# Patient Record
Sex: Male | Born: 1955 | Race: White | Hispanic: No | Smoking: Current every day smoker
Health system: Southern US, Community
[De-identification: ages and names within clinical notes are randomized; demographics above are authoritative.]

## PROBLEM LIST (undated history)

## (undated) DIAGNOSIS — I10 Essential (primary) hypertension: Secondary | ICD-10-CM

## (undated) DIAGNOSIS — J449 Chronic obstructive pulmonary disease, unspecified: Secondary | ICD-10-CM

## (undated) DIAGNOSIS — J939 Pneumothorax, unspecified: Secondary | ICD-10-CM

## (undated) HISTORY — PX: CHEST TUBE INSERTION: SHX231

---

## 2006-08-08 ENCOUNTER — Ambulatory Visit: Payer: Self-pay | Admitting: Gastroenterology

## 2007-05-17 ENCOUNTER — Ambulatory Visit (HOSPITAL_COMMUNITY): Admission: RE | Admit: 2007-05-17 | Discharge: 2007-05-17 | Payer: Self-pay | Admitting: Internal Medicine

## 2010-03-28 ENCOUNTER — Encounter: Payer: Self-pay | Admitting: Internal Medicine

## 2010-03-28 ENCOUNTER — Encounter: Payer: Self-pay | Admitting: Gastroenterology

## 2010-07-23 NOTE — H&P (Signed)
NAME:  JENNIE, BOLAR NO.:  [id number]   MEDICAL RECORD NO.:  [id number]          PATIENT TYPE:  AMB   LOCATION:                                FACILITY:  [facility]   PHYSICIAN:  Dalia Heading, M.D.  DATE OF BIRTH:  March 03, 1956   DATE OF ADMISSION:  DATE OF DISCHARGE:  LH                                HISTORY & PHYSICAL   CHIEF COMPLAINT:  Need for screening colonoscopy.   HISTORY OF PRESENT ILLNESS:  The patient is a 55 year old white male who is  referred for endoscopic evaluation. Needs a colonoscopy for screening  purposes. No abdominal pain, weight loss, nausea, vomiting, diarrhea,  constipation, melena, or hematochezia has been noted. He has never had a  colonoscopy. There is no family history of colon carcinoma.   PAST MEDICAL HISTORY:  1.  Nonspecific chronic pain.  2.  Hypertension.   PAST SURGICAL HISTORY:  1.  Vasectomy.  2.  Wrist surgery.  3.  Nasal surgery.   CURRENT MEDICATIONS:  Xanax, hydrochlorothiazide, Prilosec, Lortab.   ALLERGIES:  CODEINE and STADOL   REVIEW OF SYSTEMS:  The patient smokes a pack of cigarettes every other day.  He drinks alcohol socially.   PHYSICAL EXAMINATION:  GENERAL:  The patient is a well-developed, well-  nourished, white male in no acute distress.  LUNGS:  Clear to auscultation with equal breath sounds bilaterally.  HEART:  Reveals regular rate and rhythm without S3, S4, or murmurs.  ABDOMEN:  Soft, nontender, nondistended. No hepatosplenomegaly or masses are  noted.  RECTAL:  Deferred to the procedure.   IMPRESSION:  Need for screening colonoscopy.   PLAN:  The patient is scheduled for colonoscopy on May 03, 2005. The  risks and benefits of the procedure including bleeding and perforation were  fully explained to the patient who gave informed consent.      Dalia Heading, M.D.  Electronically Signed     MAJ/MEDQ  D:  04/12/2005  T:  04/12/2005  Job:  [id number]   cc:   [facility]  Fax: [phone]   Madelin Rear. Sherwood Gambler, MD  Fax: [phone]

## 2012-10-26 ENCOUNTER — Encounter (HOSPITAL_COMMUNITY): Payer: Self-pay | Admitting: *Deleted

## 2012-10-26 ENCOUNTER — Emergency Department (HOSPITAL_COMMUNITY)
Admission: EM | Admit: 2012-10-26 | Discharge: 2012-10-26 | Disposition: A | Discharge status: Home / Self Care | Payer: Self-pay | Attending: Emergency Medicine | Admitting: Emergency Medicine

## 2012-10-26 ENCOUNTER — Emergency Department (HOSPITAL_COMMUNITY): Payer: Self-pay

## 2012-10-26 DIAGNOSIS — Z8709 Personal history of other diseases of the respiratory system: Secondary | ICD-10-CM | POA: Insufficient documentation

## 2012-10-26 DIAGNOSIS — Z792 Long term (current) use of antibiotics: Secondary | ICD-10-CM | POA: Insufficient documentation

## 2012-10-26 DIAGNOSIS — Z79899 Other long term (current) drug therapy: Secondary | ICD-10-CM | POA: Insufficient documentation

## 2012-10-26 DIAGNOSIS — I10 Essential (primary) hypertension: Secondary | ICD-10-CM | POA: Insufficient documentation

## 2012-10-26 DIAGNOSIS — F17200 Nicotine dependence, unspecified, uncomplicated: Secondary | ICD-10-CM | POA: Insufficient documentation

## 2012-10-26 DIAGNOSIS — J441 Chronic obstructive pulmonary disease with (acute) exacerbation: Secondary | ICD-10-CM

## 2012-10-26 HISTORY — DX: Pneumothorax, unspecified: J93.9

## 2012-10-26 HISTORY — DX: Chronic obstructive pulmonary disease, unspecified: J44.9

## 2012-10-26 HISTORY — DX: Essential (primary) hypertension: I10

## 2012-10-26 LAB — BASIC METABOLIC PANEL
Chloride: 85 mEq/L — ABNORMAL LOW (ref 96–112)
GFR calc Af Amer: >90 mL/min (ref >90)
GFR calc non Af Amer: >90 mL/min (ref >90)
Glucose, Bld: 114 mg/dL — ABNORMAL HIGH (ref 70–99)
Potassium: 2.9 mEq/L — ABNORMAL LOW (ref 3.5–5.1)
Sodium: 130 mEq/L — ABNORMAL LOW (ref 135–145)

## 2012-10-26 LAB — CBC WITH DIFFERENTIAL/PLATELET
Eosinophils Absolute: 0.1 10*3/uL (ref 0.0–0.7)
Hemoglobin: 16.1 g/dL (ref 13.0–17.0)
Lymphs Abs: 1.2 10*3/uL (ref 0.7–4.0)
MCH: 33.5 pg (ref 26.0–34.0)
Monocytes Relative: 11 % (ref 3–12)
Neutro Abs: 6.8 10*3/uL (ref 1.7–7.7)
Neutrophils Relative %: 74 % (ref 43–77)
Platelets: 265 10*3/uL (ref 150–400)
RBC: 4.81 MIL/uL (ref 4.22–5.81)
WBC: 9.2 10*3/uL (ref 4.0–10.5)

## 2012-10-26 MED ORDER — POTASSIUM CHLORIDE CRYS ER 20 MEQ PO TBCR
40.0000 meq | EXTENDED_RELEASE_TABLET | Freq: Once | ORAL | Status: AC
  Administered 2012-10-26: 40 meq via ORAL
  Filled 2012-10-26: qty 2

## 2012-10-26 MED ORDER — ALBUTEROL SULFATE (5 MG/ML) 0.5% IN NEBU
5.0000 mg | INHALATION_SOLUTION | Freq: Once | RESPIRATORY_TRACT | Status: AC
  Administered 2012-10-26: 5 mg via RESPIRATORY_TRACT
  Filled 2012-10-26: qty 1

## 2012-10-26 MED ORDER — AMOXICILLIN 500 MG PO CAPS: 500.0000 mg | ORAL_CAPSULE | Freq: Three times a day (TID) | ORAL | Status: AC

## 2012-10-26 MED ORDER — IPRATROPIUM BROMIDE HFA 17 MCG/ACT IN AERS: 2.0000 | INHALATION_SPRAY | Freq: Four times a day (QID) | RESPIRATORY_TRACT | Status: AC

## 2012-10-26 MED ORDER — IPRATROPIUM BROMIDE 0.02 % IN SOLN
0.5000 mg | Freq: Once | RESPIRATORY_TRACT | Status: AC
Start: 2012-10-26 — End: 2012-10-26
  Administered 2012-10-26: 0.5 mg via RESPIRATORY_TRACT
  Filled 2012-10-26: qty 2.5

## 2012-10-26 NOTE — ED Notes (Signed)
Worsening sob x 2 wks.  Speaking clear full sentences in triage.  Unlabored breathing in triage.

## 2012-10-26 NOTE — ED Provider Notes (Signed)
CSN: [id number]     Arrival date & time 10/26/12  1740 History     First MD Initiated Contact with Patient 10/26/12 1820     Chief Complaint  Patient presents with  . Shortness of Breath   (Consider location/radiation/quality/duration/timing/severity/associated sxs/prior Treatment) Patient is a 57 y.o. male presenting with shortness of breath. The history is provided by the patient (the pt complains of sob).  Shortness of Breath Severity:  Moderate Onset quality:  Gradual Timing:  Constant Progression:  Worsening Chronicity:  Recurrent Context: activity   Associated symptoms: no abdominal pain, no chest pain, no cough, no headaches and no rash     Past Medical History  Diagnosis Date  . COPD (chronic obstructive pulmonary disease)   . Pneumothorax   . Hypertension    Past Surgical History  Procedure Laterality Date  . Chest tube insertion     No family history on file. History  Substance Use Topics  . Smoking status: Current Every Day Smoker    Types: Cigarettes  . Smokeless tobacco: Not on file  . Alcohol Use: No    Review of Systems  Constitutional: Negative for appetite change and fatigue.  HENT: Negative for congestion, sinus pressure and ear discharge.   Eyes: Negative for discharge.  Respiratory: Positive for shortness of breath. Negative for cough.   Cardiovascular: Negative for chest pain.  Gastrointestinal: Negative for abdominal pain and diarrhea.  Genitourinary: Negative for frequency and hematuria.  Musculoskeletal: Negative for back pain.  Skin: Negative for rash.  Neurological: Negative for seizures and headaches.  Psychiatric/Behavioral: Negative for hallucinations.    Allergies  Codeine and Stadol  Home Medications   Current Outpatient Rx  Name  Route  Sig  Dispense  Refill  . albuterol (PROVENTIL HFA;VENTOLIN HFA) 108 (90 BASE) MCG/ACT inhaler   Inhalation   Inhale 2 puffs into the lungs every 6 (six) hours as needed for wheezing.         . clonazePAM (KLONOPIN) 0.5 MG tablet   Oral   Take 0.5 mg by mouth 3 (three) times daily as needed for anxiety.         . diazepam (VALIUM) 10 MG tablet   Oral   Take 10 mg by mouth every 6 (six) hours as needed for anxiety.         Marland Kitchen FLUoxetine (PROZAC) 20 MG capsule   Oral   Take 20 mg by mouth daily.         . hydrochlorothiazide (HYDRODIURIL) 25 MG tablet   Oral   Take 25 mg by mouth daily.         . magnesium oxide (MAG-OX) 400 MG tablet   Oral   Take 400 mg by mouth daily.         . Multiple Vitamin (MULTIVITAMIN WITH MINERALS) TABS tablet   Oral   Take 1 tablet by mouth daily.         Marland Kitchen oxyCODONE (ROXICODONE) 15 MG immediate release tablet   Oral   Take 15 mg by mouth every 4 (four) hours as needed for pain.         Marland Kitchen oxycodone (ROXICODONE) 30 MG immediate release tablet   Oral   Take 30 mg by mouth every 4 (four) hours as needed for pain.         . potassium chloride SA (K-DUR,KLOR-CON) 20 MEQ tablet   Oral   Take 20 mEq by mouth daily.         Marland Kitchen  amoxicillin (AMOXIL) 500 MG capsule   Oral   Take 1 capsule (500 mg total) by mouth 3 (three) times daily.   21 capsule   0   . ipratropium (ATROVENT HFA) 17 MCG/ACT inhaler   Inhalation   Inhale 2 puffs into the lungs every 6 (six) hours.   1 Inhaler   12    BP 109/82  Pulse 86  Temp(Src) 98.1 F (36.7 C) (Oral)  Resp 18  Ht 6\' 1"  (1.854 m)  Wt 190 lb (86.183 kg)  BMI 25.07 kg/m2  SpO2 82% Physical Exam  Constitutional: He is oriented to person, place, and time. He appears well-developed.  HENT:  Head: Normocephalic.  Eyes: Conjunctivae and EOM are normal. No scleral icterus.  Neck: Neck supple. No thyromegaly present.  Cardiovascular: Normal rate and regular rhythm.  Exam reveals no gallop and no friction rub.   No murmur heard. Pulmonary/Chest: No stridor. He has wheezes. He has no rales. He exhibits no tenderness.  Abdominal: He exhibits no distension. There is no  tenderness. There is no rebound.  Musculoskeletal: Normal range of motion. He exhibits no edema.  Lymphadenopathy:    He has no cervical adenopathy.  Neurological: He is oriented to person, place, and time. Coordination normal.  Skin: No rash noted. No erythema.  Psychiatric: He has a normal mood and affect. His behavior is normal.    ED Course   Procedures (including critical care time)  Labs Reviewed  BASIC METABOLIC PANEL - Abnormal; Notable for the following:    Sodium 130 (*)    Potassium 2.9 (*)    Chloride 85 (*)    CO2 35 (*)    Glucose, Bld 114 (*)    All other components within normal limits  CBC WITH DIFFERENTIAL  TROPONIN I   Dg Chest 2 View  10/26/2012   *RADIOLOGY REPORT*  Clinical Data: Shortness of breath and cough.  CHEST - 2 VIEW  Comparison: 05/17/2007  Findings: Lungs are adequately inflated without consolidation or effusion.  The cardiomediastinal silhouette is within normal. There is spondylosis of the spine.  IMPRESSION: No acute cardiopulmonary disease.   Original Report Authenticated By: Elberta Fortis, M.D.   1. COPD exacerbation     MDM    Benny Lennert, MD 10/26/12 [phone]

## 2014-11-14 ENCOUNTER — Encounter (HOSPITAL_COMMUNITY): Payer: Self-pay | Admitting: Emergency Medicine

## 2014-11-14 ENCOUNTER — Emergency Department (HOSPITAL_COMMUNITY): Payer: Medicare Other

## 2014-11-14 ENCOUNTER — Emergency Department (HOSPITAL_COMMUNITY)
Admission: EM | Admit: 2014-11-14 | Discharge: 2014-11-15 | Disposition: A | Discharge status: Home / Self Care | Payer: Medicare Other | Attending: Emergency Medicine | Admitting: Emergency Medicine

## 2014-11-14 DIAGNOSIS — R531 Weakness: Secondary | ICD-10-CM | POA: Insufficient documentation

## 2014-11-14 DIAGNOSIS — R079 Chest pain, unspecified: Secondary | ICD-10-CM | POA: Insufficient documentation

## 2014-11-14 DIAGNOSIS — R5383 Other fatigue: Secondary | ICD-10-CM | POA: Diagnosis not present

## 2014-11-14 DIAGNOSIS — Z720 Tobacco use: Secondary | ICD-10-CM | POA: Insufficient documentation

## 2014-11-14 DIAGNOSIS — I10 Essential (primary) hypertension: Secondary | ICD-10-CM | POA: Insufficient documentation

## 2014-11-14 DIAGNOSIS — Z79899 Other long term (current) drug therapy: Secondary | ICD-10-CM | POA: Diagnosis not present

## 2014-11-14 DIAGNOSIS — Z7982 Long term (current) use of aspirin: Secondary | ICD-10-CM | POA: Insufficient documentation

## 2014-11-14 DIAGNOSIS — Z87828 Personal history of other (healed) physical injury and trauma: Secondary | ICD-10-CM | POA: Diagnosis not present

## 2014-11-14 DIAGNOSIS — J449 Chronic obstructive pulmonary disease, unspecified: Secondary | ICD-10-CM | POA: Diagnosis not present

## 2014-11-14 DIAGNOSIS — R2242 Localized swelling, mass and lump, left lower limb: Secondary | ICD-10-CM | POA: Insufficient documentation

## 2014-11-14 DIAGNOSIS — Z89222 Acquired absence of left upper limb above elbow: Secondary | ICD-10-CM | POA: Diagnosis not present

## 2014-11-14 DIAGNOSIS — R4182 Altered mental status, unspecified: Secondary | ICD-10-CM | POA: Diagnosis not present

## 2014-11-14 DIAGNOSIS — R300 Dysuria: Secondary | ICD-10-CM | POA: Diagnosis present

## 2014-11-14 LAB — CBC WITH DIFFERENTIAL/PLATELET
BASOS PCT: 1 % (ref 0–1)
Basophils Absolute: 0 10*3/uL (ref 0.0–0.1)
EOS ABS: 0.2 10*3/uL (ref 0.0–0.7)
Eosinophils Relative: 6 % — ABNORMAL HIGH (ref 0–5)
HCT: 30.4 % — ABNORMAL LOW (ref 39.0–52.0)
HEMOGLOBIN: 10 g/dL — AB (ref 13.0–17.0)
Lymphocytes Relative: 36 % (ref 12–46)
Lymphs Abs: 1 10*3/uL (ref 0.7–4.0)
MCH: 33.6 pg (ref 26.0–34.0)
MCHC: 32.9 g/dL (ref 30.0–36.0)
MCV: 102 fL — ABNORMAL HIGH (ref 78.0–100.0)
MONO ABS: 0.4 10*3/uL (ref 0.1–1.0)
MONOS PCT: 16 % — AB (ref 3–12)
NEUTROS PCT: 40 % — AB (ref 43–77)
Neutro Abs: 1.1 10*3/uL — ABNORMAL LOW (ref 1.7–7.7)
Platelets: 114 10*3/uL — ABNORMAL LOW (ref 150–400)
RBC: 2.98 MIL/uL — ABNORMAL LOW (ref 4.22–5.81)
RDW: 15.1 % (ref 11.5–15.5)
WBC: 2.7 10*3/uL — ABNORMAL LOW (ref 4.0–10.5)

## 2014-11-14 LAB — COMPREHENSIVE METABOLIC PANEL
ALBUMIN: 2 g/dL — AB (ref 3.5–5.0)
ALT: 27 U/L (ref 17–63)
ANION GAP: 5 (ref 5–15)
AST: 58 U/L — ABNORMAL HIGH (ref 15–41)
Alkaline Phosphatase: 117 U/L (ref 38–126)
BUN: 6 mg/dL (ref 6–20)
CHLORIDE: 97 mmol/L — AB (ref 101–111)
CO2: 33 mmol/L — AB (ref 22–32)
Calcium: 7.6 mg/dL — ABNORMAL LOW (ref 8.9–10.3)
Creatinine, Ser: 0.77 mg/dL (ref 0.61–1.24)
GFR calc Af Amer: >60 mL/min (ref >60)
GFR calc non Af Amer: >60 mL/min (ref >60)
GLUCOSE: 110 mg/dL — AB (ref 65–99)
POTASSIUM: 3 mmol/L — AB (ref 3.5–5.1)
SODIUM: 135 mmol/L (ref 135–145)
Total Bilirubin: 0.9 mg/dL (ref 0.3–1.2)
Total Protein: 5.7 g/dL — ABNORMAL LOW (ref 6.5–8.1)

## 2014-11-14 LAB — URINALYSIS, ROUTINE W REFLEX MICROSCOPIC
BILIRUBIN URINE: NEGATIVE
Glucose, UA: NEGATIVE mg/dL
Hgb urine dipstick: NEGATIVE
KETONES UR: NEGATIVE mg/dL
Leukocytes, UA: NEGATIVE
NITRITE: NEGATIVE
PH: 6.5 (ref 5.0–8.0)
Protein, ur: NEGATIVE mg/dL
Specific Gravity, Urine: 1.01 (ref 1.005–1.030)
Urobilinogen, UA: 2 mg/dL — ABNORMAL HIGH (ref 0.0–1.0)

## 2014-11-14 MED ORDER — NALOXONE HCL 1 MG/ML IJ SOLN
2.0000 mg | Freq: Once | INTRAMUSCULAR | Status: AC
  Administered 2014-11-14: 2 mg via INTRAVENOUS
  Filled 2014-11-14: qty 2

## 2014-11-14 MED ORDER — SODIUM CHLORIDE 0.9 % IV BOLUS (SEPSIS)
500.0000 mL | Freq: Once | INTRAVENOUS | Status: AC
  Administered 2014-11-14: 500 mL via INTRAVENOUS

## 2014-11-14 MED ORDER — NALOXONE HCL 0.4 MG/ML IJ SOLN
0.4000 mg | Freq: Once | INTRAMUSCULAR | Status: AC
  Administered 2014-11-14: 0.4 mg via INTRAVENOUS
  Filled 2014-11-14: qty 1

## 2014-11-14 MED ORDER — SODIUM CHLORIDE 0.9 % IV SOLN
INTRAVENOUS | Status: DC
  Administered 2014-11-14: 21:00:00 via INTRAVENOUS

## 2014-11-14 NOTE — ED Notes (Signed)
Wife back in room with pt at bedside while Narcan administered . No change in pt's condition or behavior at this time.

## 2014-11-14 NOTE — Discharge Instructions (Signed)
Extensive workup here without any significant findings. Would recommend continued follow-up with the services at [facility]. Would recommend on the work through the home health nurse for possible rehabilitation center placement. Return for any new or worse symptoms.

## 2014-11-14 NOTE — ED Notes (Signed)
o2 was reduced to 1l by Dr Elbert Ewings when he was in room with pt . O2 sat 98-99 %

## 2014-11-14 NOTE — ED Notes (Signed)
Pt resting quietly prior to admin of narcan , during administration pt's breathing noted to incresae in rate , started moving around in bed, yawning . O2 sat noted to be up to 98%
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
## 2014-11-14 NOTE — ED Notes (Addendum)
Pt yelling and moaning - not opening his eyes, not talking to staff or his wife. Dr at bedside.  Oxygen stopped at this time by MD

## 2014-11-14 NOTE — ED Notes (Signed)
Sine o2 turned off by MD O2 sats noted in low 90's on RA

## 2014-11-14 NOTE — ED Notes (Signed)
Pt returned from having CT scan done.

## 2014-11-15 NOTE — ED Notes (Signed)
Patient transported back to home per [facility] all vital signs stable, no distress noted.

## 2016-12-13 IMAGING — CT CT HEAD W/O CM
1 series · 16 of 30 positions shown, 20 images · non-contrast
Comparison: None.

CLINICAL DATA: 59-year-old male with trauma.

EXAM:
CT HEAD WITHOUT CONTRAST
TECHNIQUE: Contiguous axial images were obtained from the base of the skull
through the vertex without intravenous contrast.

[Series 2: headtrauma 4.8 h37s · axial · 0.44mm/px · z∈[+118,+273]mm · 16 of 36 slices shown, 20 images]
[im 2/36  brain]
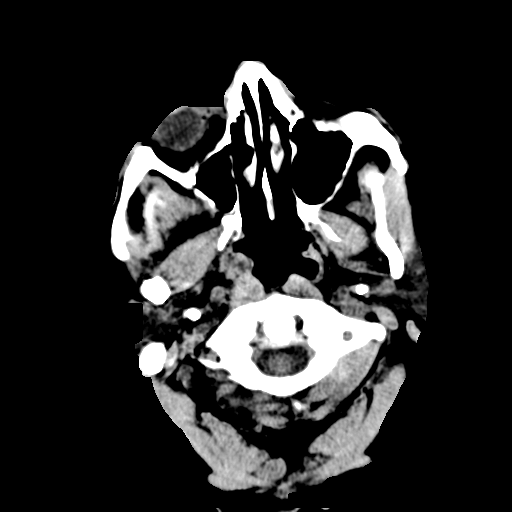
[im 2/36  bone]
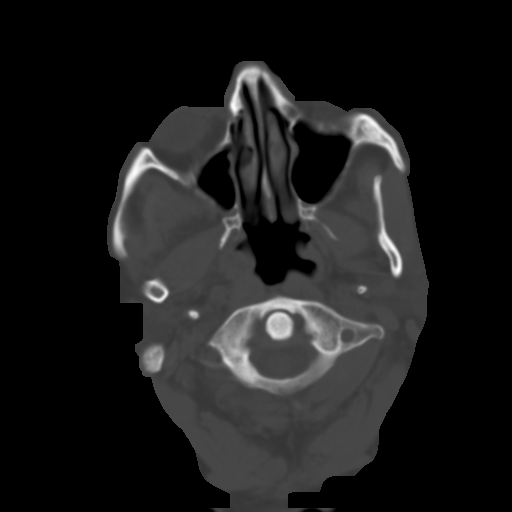
[im 4/36  brain]
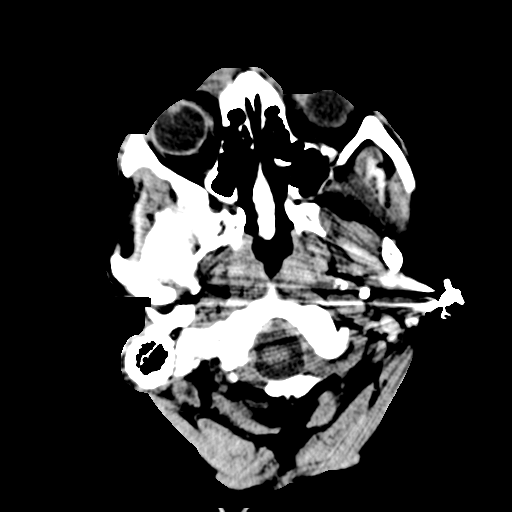
[im 7/36  brain]
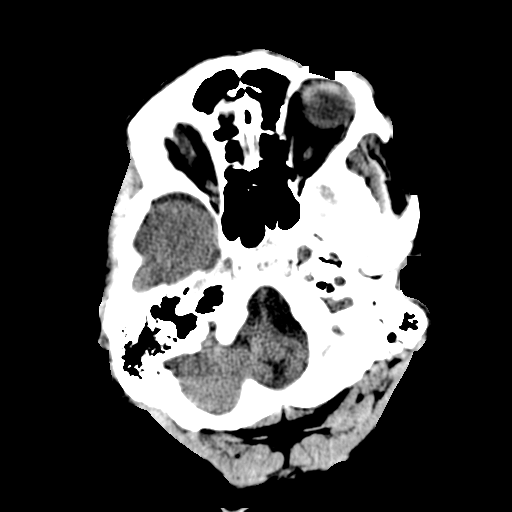
[im 9/36  brain]
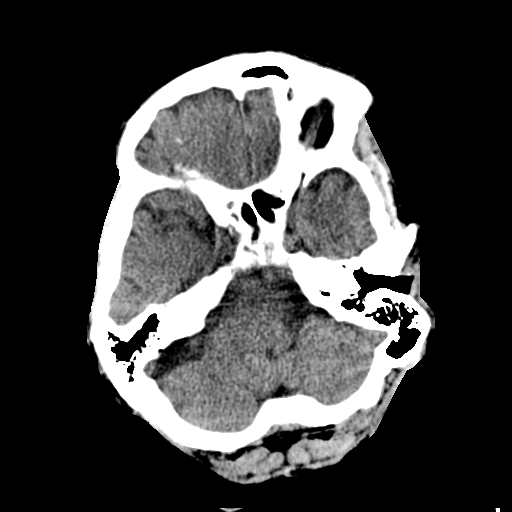
[im 10/36  brain]
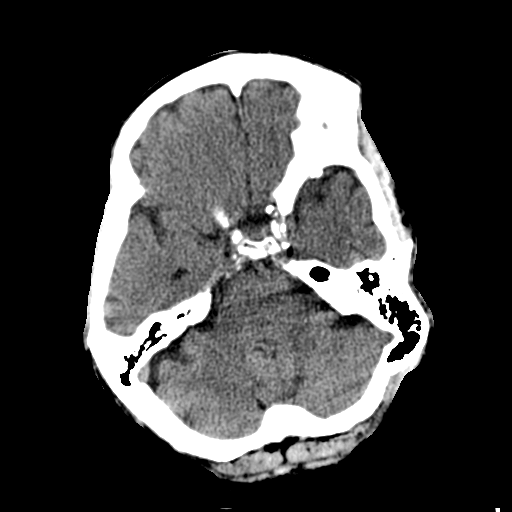
[im 10/36  bone]
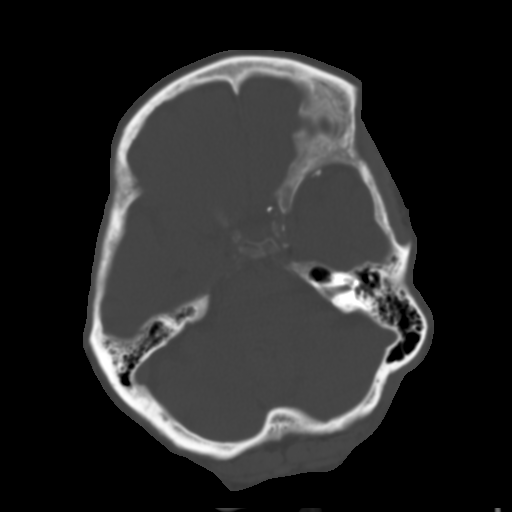
[im 13/36  brain]
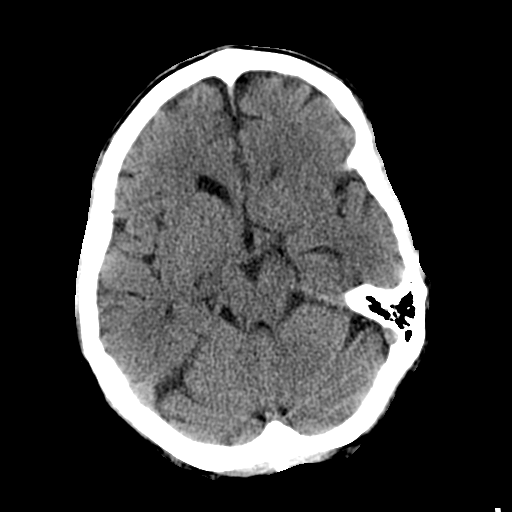
[im 15/36  brain]
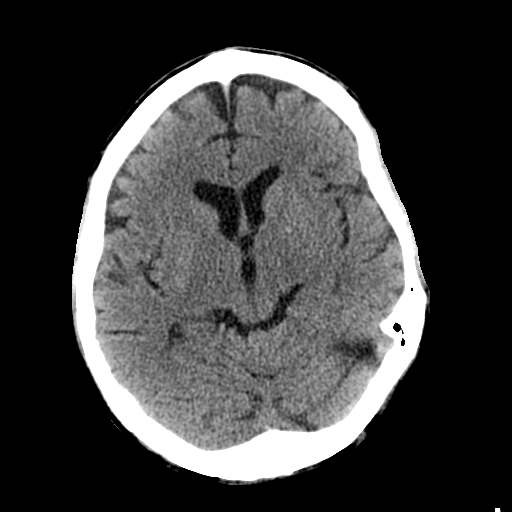
[im 17/36  brain]
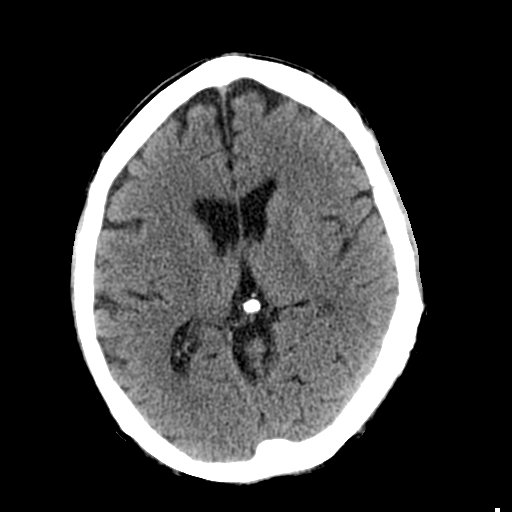
[im 19/36  brain]
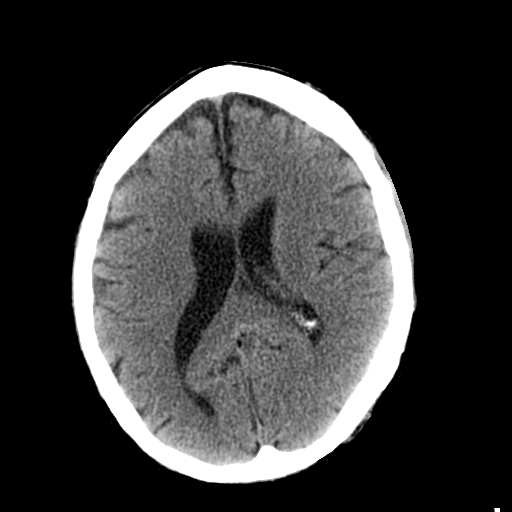
[im 19/36  bone]
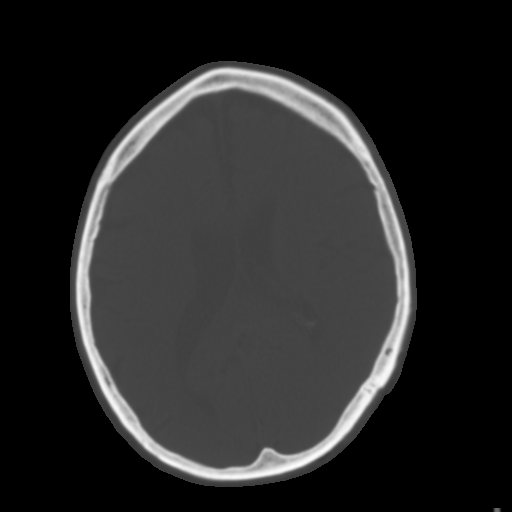
[im 21/36  brain]
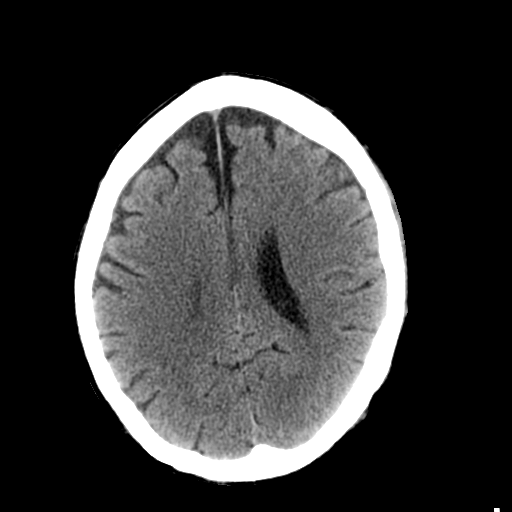
[im 23/36  brain]
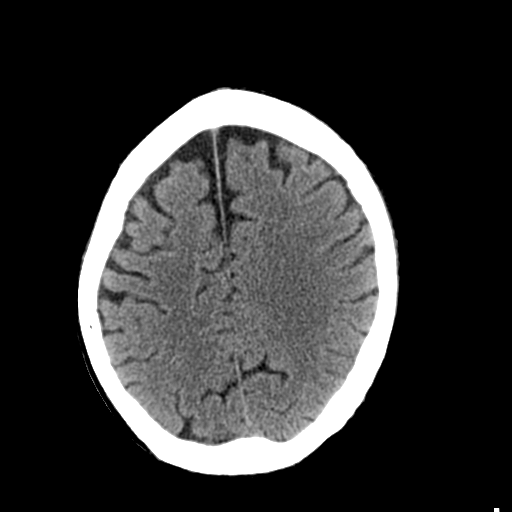
[im 26/36  brain]
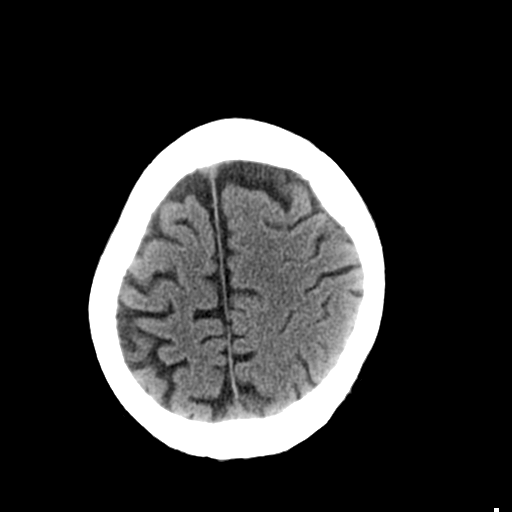
[im 27/36  brain]
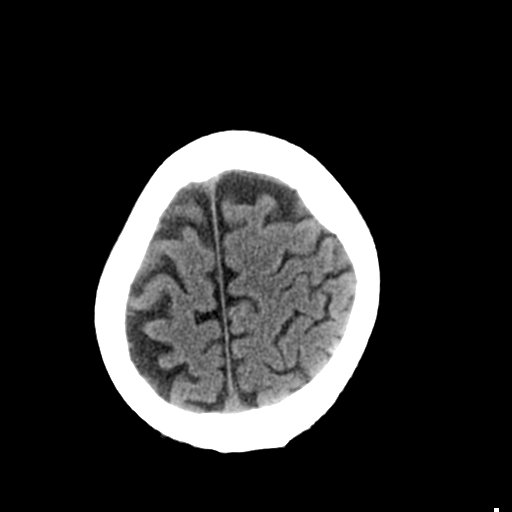
[im 27/36  bone]
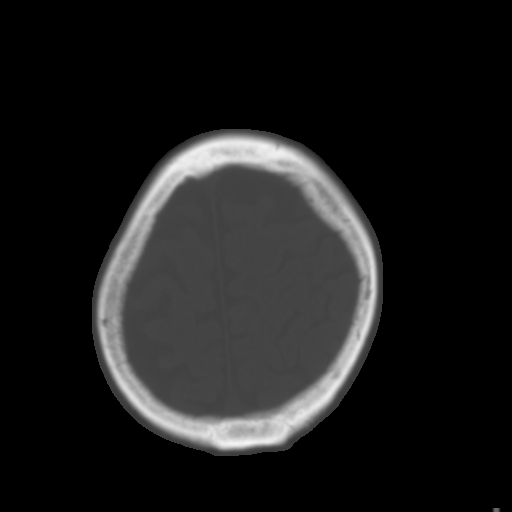
[im 29/36  brain]
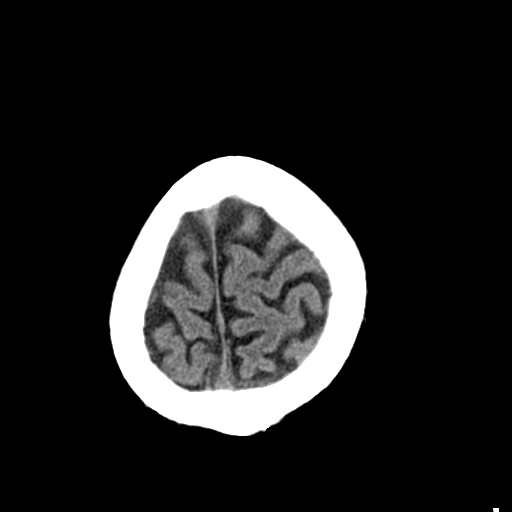
[im 32/36  brain]
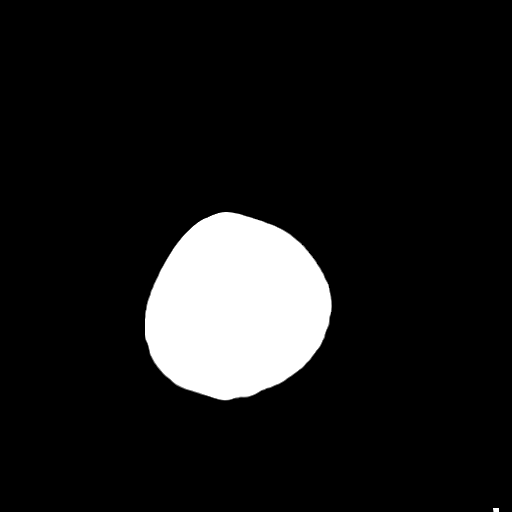
[im 34/36  brain]
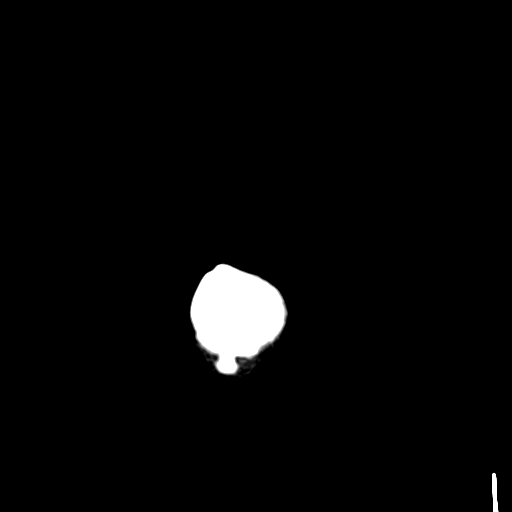

[16 of 30 positions shown; findings below may reference images not displayed]

FINDINGS: The ventricles and the sulci are appropriate in size for the
patient's age. There is no intracranial hemorrhage. No midline shift
or mass effect identified. The gray-white matter differentiation is
preserved.

The visualized paranasal sinuses and mastoid air cells are well
aerated. The calvarium is intact.
IMPRESSION: No acute intracranial pathology.

## 2022-03-07 DEATH — deceased
